# Patient Record
Sex: Female | Born: 1990 | Race: Black or African American | Hispanic: No | Marital: Single | Smoking: Never smoker
Health system: Southern US, Community
[De-identification: ages and names within clinical notes are randomized; demographics above are authoritative.]

## PROBLEM LIST (undated history)

## (undated) DIAGNOSIS — E162 Hypoglycemia, unspecified: Secondary | ICD-10-CM

---

## 2012-11-05 ENCOUNTER — Emergency Department (HOSPITAL_COMMUNITY): Payer: Federal, State, Local not specified - PPO

## 2012-11-05 ENCOUNTER — Emergency Department (HOSPITAL_COMMUNITY)
Admission: EM | Admit: 2012-11-05 | Discharge: 2012-11-05 | Disposition: A | Discharge status: Home / Self Care | Payer: Federal, State, Local not specified - PPO | Attending: Emergency Medicine | Admitting: Emergency Medicine

## 2012-11-05 DIAGNOSIS — Z79899 Other long term (current) drug therapy: Secondary | ICD-10-CM | POA: Diagnosis not present

## 2012-11-05 DIAGNOSIS — S0990XA Unspecified injury of head, initial encounter: Secondary | ICD-10-CM | POA: Insufficient documentation

## 2012-11-05 DIAGNOSIS — IMO0002 Reserved for concepts with insufficient information to code with codable children: Secondary | ICD-10-CM | POA: Diagnosis not present

## 2012-11-05 DIAGNOSIS — S298XXA Other specified injuries of thorax, initial encounter: Secondary | ICD-10-CM | POA: Insufficient documentation

## 2012-11-05 DIAGNOSIS — Y939 Activity, unspecified: Secondary | ICD-10-CM | POA: Insufficient documentation

## 2012-11-05 DIAGNOSIS — Y929 Unspecified place or not applicable: Secondary | ICD-10-CM | POA: Diagnosis not present

## 2012-11-05 DIAGNOSIS — R079 Chest pain, unspecified: Secondary | ICD-10-CM

## 2012-11-05 MED ORDER — IBUPROFEN 600 MG PO TABS: 600.0000 mg | ORAL_TABLET | Freq: Four times a day (QID) | ORAL | Status: DC | PRN
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
## 2012-11-06 NOTE — ED Provider Notes (Signed)
Medical screening examination/treatment/procedure(s) were performed by non-physician practitioner and as supervising physician I was immediately available for consultation/collaboration.   Celene Kras, MD 11/06/12 [phone]

## 2013-09-17 ENCOUNTER — Emergency Department (HOSPITAL_COMMUNITY)
Admission: EM | Admit: 2013-09-17 | Discharge: 2013-09-17 | Disposition: A | Discharge status: Home / Self Care | Payer: Federal, State, Local not specified - PPO | Attending: Emergency Medicine | Admitting: Emergency Medicine

## 2013-09-17 ENCOUNTER — Encounter (HOSPITAL_COMMUNITY): Payer: Self-pay | Admitting: Emergency Medicine

## 2013-09-17 ENCOUNTER — Emergency Department (HOSPITAL_COMMUNITY): Payer: Federal, State, Local not specified - PPO

## 2013-09-17 DIAGNOSIS — J180 Bronchopneumonia, unspecified organism: Secondary | ICD-10-CM

## 2013-09-17 DIAGNOSIS — Z862 Personal history of diseases of the blood and blood-forming organs and certain disorders involving the immune mechanism: Secondary | ICD-10-CM | POA: Insufficient documentation

## 2013-09-17 DIAGNOSIS — Z8639 Personal history of other endocrine, nutritional and metabolic disease: Secondary | ICD-10-CM | POA: Insufficient documentation

## 2013-09-17 DIAGNOSIS — R000 Tachycardia, unspecified: Secondary | ICD-10-CM | POA: Insufficient documentation

## 2013-09-17 DIAGNOSIS — Z79899 Other long term (current) drug therapy: Secondary | ICD-10-CM | POA: Insufficient documentation

## 2013-09-17 DIAGNOSIS — IMO0002 Reserved for concepts with insufficient information to code with codable children: Secondary | ICD-10-CM | POA: Insufficient documentation

## 2013-09-17 DIAGNOSIS — R112 Nausea with vomiting, unspecified: Secondary | ICD-10-CM | POA: Insufficient documentation

## 2013-09-17 HISTORY — DX: Hypoglycemia, unspecified: E16.2

## 2013-09-17 MED ORDER — PROMETHAZINE HCL 25 MG PO TABS
25.0000 mg | ORAL_TABLET | Freq: Four times a day (QID) | ORAL | Status: AC | PRN
Start: 2013-09-17 — End: ?

## 2013-09-17 MED ORDER — AZITHROMYCIN 250 MG PO TABS: 250.0000 mg | ORAL_TABLET | Freq: Every day | ORAL | Status: AC

## 2013-09-17 MED ORDER — ONDANSETRON 4 MG PO TBDP
4.0000 mg | ORAL_TABLET | Freq: Once | ORAL | Status: AC
  Administered 2013-09-17: 4 mg via ORAL
  Filled 2013-09-17: qty 1
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
## 2013-09-17 NOTE — ED Notes (Signed)
PT came via ems for green colored sputum and feels nauseated.  Has been taking nyquil with no relief.  VS stable.  Hx of hypoglycemia - cbg 98 per EMS.

## 2013-09-17 NOTE — Discharge Instructions (Signed)
Be sure to complete all of your antibiotics even if you start to feel better before they are finished.  You may take phenergan as needed for nausea.    Take 400-600 mg Ibuprofen (Motrin) every 6-8 hours for fever and pain  Alternate with Tylenol  Take [phone] mg Tylenol every 4-6 hours as needed for fever and pain  Follow-up with your primary care provider next week for recheck of symptoms if not improving.  Be sure to drink plenty of fluids and rest, at least 8hrs of sleep a night, preferably more while you are sick. Return to the ED if you cannot keep down fluids/signs of dehydration, fever not reducing with Tylenol, difficulty breathing/wheezing, stiff neck, worsening condition, or other concerns (see below)   Pneumonia, Adult Pneumonia is an infection of the lungs. It may be caused by a germ (virus or bacteria). Some types of pneumonia can spread easily from person to person. This can happen when you cough or sneeze. HOME CARE  Only take medicine as told by your doctor.  Take your medicine (antibiotics) as told. Finish it even if you start to feel better.  Do not smoke.  You may use a vaporizer or humidifier in your room. This can help loosen thick spit (mucus).  Sleep so you are almost sitting up (semi-upright). This helps reduce coughing.  Rest. A shot (vaccine) can help prevent pneumonia. Shots are often advised for:  People over 23 years old.  Patients on chemotherapy.  People with long-term (chronic) lung problems.  People with immune system problems. GET HELP RIGHT AWAY IF:   You are getting worse.  You cannot control your cough, and you are losing sleep.  You cough up blood.  Your pain gets worse, even with medicine.  You have a fever.  Any of your problems are getting worse, not better.  You have shortness of breath or chest pain. MAKE SURE YOU:   Understand these instructions.  Will watch your condition.  Will get help right away if you are not  doing well or get worse. Document Released: 11/14/2007 Document Revised: 08/20/2011 Document Reviewed: 08/18/2010 [facility] Patient Information 2014 [employer] [place].  Bronchitis Bronchitis is swelling (inflammation) of the air tubes leading to your lungs (bronchi). This causes mucus and a cough. If the swelling gets bad, you may have trouble breathing. HOME CARE   Rest.  Drink enough fluids to keep your pee (urine) clear or pale yellow (unless you have a condition where you have to watch how much you drink).  Only take medicine as told by your doctor. If you were given antibiotic medicines, finish them even if you start to feel better.  Avoid smoke, irritating chemicals, and strong smells. These make the problem worse. Quit smoking if you smoke. This helps your lungs heal faster.  Use a cool mist humidifier. Change the water in the humidifier every day. You can also sit in the bathroom with hot shower running for 5 10 minutes. Keep the door closed.  See your health care provider as told.  Wash your hands often. GET HELP IF: Your problems do not get better after 1 week. GET HELP RIGHT AWAY IF:   Your fever gets worse.  You have chills.  Your chest hurts.  Your problems breathing get worse.  You have blood in your mucus.  You pass out (faint).  You feel lightheaded.  You have a bad headache.  You throw up (vomit) again and again. MAKE SURE YOU:  Understand these  instructions.  Will watch your condition.  Will get help right away if you are not doing well or get worse. Document Released: 11/14/2007 Document Revised: 03/18/2013 Document Reviewed: 01/20/2013 [facility] Patient Information 2014 [place], [place].
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
## 2013-09-19 NOTE — ED Provider Notes (Signed)
Medical screening examination/treatment/procedure(s) were performed by non-physician practitioner and as supervising physician I was immediately available for consultation/collaboration.   EKG Interpretation None       Cambell Rickenbach L Kylar Speelman, MD 09/19/13 0912 

## 2013-09-30 ENCOUNTER — Emergency Department (HOSPITAL_COMMUNITY)
Admission: EM | Admit: 2013-09-30 | Discharge: 2013-09-30 | Disposition: A | Discharge status: Home / Self Care | Payer: Federal, State, Local not specified - PPO | Attending: Emergency Medicine | Admitting: Emergency Medicine

## 2013-09-30 ENCOUNTER — Encounter (HOSPITAL_COMMUNITY): Payer: Self-pay | Admitting: Emergency Medicine

## 2013-09-30 ENCOUNTER — Emergency Department (HOSPITAL_COMMUNITY): Payer: Federal, State, Local not specified - PPO

## 2013-09-30 DIAGNOSIS — S1093XA Contusion of unspecified part of neck, initial encounter: Principal | ICD-10-CM

## 2013-09-30 DIAGNOSIS — S0083XA Contusion of other part of head, initial encounter: Secondary | ICD-10-CM | POA: Insufficient documentation

## 2013-09-30 DIAGNOSIS — Z862 Personal history of diseases of the blood and blood-forming organs and certain disorders involving the immune mechanism: Secondary | ICD-10-CM | POA: Insufficient documentation

## 2013-09-30 DIAGNOSIS — S8010XA Contusion of unspecified lower leg, initial encounter: Secondary | ICD-10-CM | POA: Insufficient documentation

## 2013-09-30 DIAGNOSIS — Z79899 Other long term (current) drug therapy: Secondary | ICD-10-CM | POA: Insufficient documentation

## 2013-09-30 DIAGNOSIS — IMO0002 Reserved for concepts with insufficient information to code with codable children: Secondary | ICD-10-CM | POA: Insufficient documentation

## 2013-09-30 DIAGNOSIS — S8011XA Contusion of right lower leg, initial encounter: Secondary | ICD-10-CM

## 2013-09-30 DIAGNOSIS — S0003XA Contusion of scalp, initial encounter: Secondary | ICD-10-CM | POA: Insufficient documentation

## 2013-09-30 DIAGNOSIS — Z8639 Personal history of other endocrine, nutritional and metabolic disease: Secondary | ICD-10-CM | POA: Insufficient documentation

## 2013-09-30 MED ORDER — CYCLOBENZAPRINE HCL 10 MG PO TABS: 10.0000 mg | ORAL_TABLET | Freq: Two times a day (BID) | ORAL | Status: AC | PRN

## 2013-09-30 MED ORDER — ACETAMINOPHEN 325 MG PO TABS
650.0000 mg | ORAL_TABLET | Freq: Once | ORAL | Status: AC
  Administered 2013-09-30: 650 mg via ORAL
  Filled 2013-09-30: qty 2

## 2013-09-30 MED ORDER — TRAMADOL HCL 50 MG PO TABS: 50.0000 mg | ORAL_TABLET | Freq: Four times a day (QID) | ORAL | Status: AC | PRN
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
## 2013-10-01 NOTE — ED Provider Notes (Signed)
Medical screening examination/treatment/procedure(s) were performed by non-physician practitioner and as supervising physician I was immediately available for consultation/collaboration.   EKG Interpretation None        Charles B. Sheldon, MD 10/01/13 1542 
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
